# Patient Record
Sex: Male | Born: 1995 | Race: White | Hispanic: No | Marital: Single | Smoking: Never smoker
Health system: Southern US, Community
[De-identification: ages and names within clinical notes are randomized; demographics above are authoritative.]

## PROBLEM LIST (undated history)

## (undated) DIAGNOSIS — G43909 Migraine, unspecified, not intractable, without status migrainosus: Secondary | ICD-10-CM

---
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
## 2015-11-11 ENCOUNTER — Encounter (HOSPITAL_COMMUNITY): Payer: Self-pay

## 2015-11-11 DIAGNOSIS — L259 Unspecified contact dermatitis, unspecified cause: Secondary | ICD-10-CM | POA: Insufficient documentation

## 2015-11-11 NOTE — ED Triage Notes (Signed)
Pt states rash across body. Complaining of urticaria. Denies any SOB or chest pain. Pt states "working in woods recently."

## 2015-11-12 ENCOUNTER — Emergency Department (HOSPITAL_COMMUNITY)
Admission: EM | Admit: 2015-11-12 | Discharge: 2015-11-12 | Disposition: A | Discharge status: Home / Self Care | Payer: Self-pay | Attending: Emergency Medicine | Admitting: Emergency Medicine

## 2015-11-12 DIAGNOSIS — L259 Unspecified contact dermatitis, unspecified cause: Secondary | ICD-10-CM

## 2015-11-12 MED ORDER — HYDROXYZINE HCL 25 MG PO TABS: 25.0000 mg | ORAL_TABLET | Freq: Four times a day (QID) | ORAL | 0 refills | Status: AC

## 2015-11-12 MED ORDER — PREDNISONE 10 MG (21) PO TBPK: ORAL_TABLET | ORAL | 0 refills | Status: AC

## 2015-11-12 NOTE — ED Provider Notes (Signed)
[facility]-EMERGENCY DEPT Provider Note   CSN: [id number] Arrival date & time: 11/11/15  2210  First Provider Contact:  None       History   Chief Complaint Chief Complaint  Patient presents with  . Rash    HPI LJ MIYAMOTO is a 20 y.o. male.  Patient developed a red, pruritic rash after working in the woods yesterday.  Thinks he may have contacted poisonous plants.    Rash   This is a new problem. The current episode started yesterday. The problem has been gradually worsening. The problem is associated with plant contact. There has been no fever. The rash is present on the face and torso. The pain is at a severity of 0/10. Associated symptoms include itching. He has tried nothing for the symptoms.    History reviewed. No pertinent past medical history.  There are no active problems to display for this patient.   History reviewed. No pertinent surgical history.     Home Medications    Prior to Admission medications   Not on File    Family History History reviewed. No pertinent family history.  Social History Social History  Substance Use Topics  . Smoking status: Never Smoker  . Smokeless tobacco: Never Used  . Alcohol use No     Allergies   Review of patient's allergies indicates not on file.   Review of Systems Review of Systems  Skin: Positive for itching and rash.  All other systems reviewed and are negative.    Physical Exam Updated Vital Signs BP 127/71   Pulse (!) 56   Temp 98.1 F (36.7 C) (Oral)   Resp 14   Ht  (1.905 m)   Wt 68 kg   SpO2 98%   BMI 18.75 kg/m   Physical Exam  Constitutional: He is oriented to person, place, and time. He appears well-developed and well-nourished.  HENT:  Head: Normocephalic.  Vesicular rash on left lower face.  Eyes: Conjunctivae are normal. Pupils are equal, round, and reactive to light.  Neck: Neck supple.  Cardiovascular: Normal rate and regular rhythm.   Pulmonary/Chest: Effort  normal and breath sounds normal.  Abdominal: Soft. Bowel sounds are normal.  Musculoskeletal: Normal range of motion.  Lymphadenopathy:    He has no cervical adenopathy.  Neurological: He is alert and oriented to person, place, and time.  Skin: Rash noted. There is erythema.  Erythematous, pruritic, vesicular rash noted on face, torso, extremities.  Nursing note and vitals reviewed.    ED Treatments / Results  Labs (all labs ordered are listed, but only abnormal results are displayed) Labs Reviewed - No data to display  EKG  EKG Interpretation None       Radiology No results found.  Procedures Procedures (including critical care time)  Medications Ordered in ED Medications - No data to display   Initial Impression / Assessment and Plan / ED Course  I have reviewed the triage vital signs and the nursing notes.  Pertinent labs & imaging results that were available during my care of the patient were reviewed by me and considered in my medical decision making (see chart for details).  Clinical Course      Patient with contact dermatitis. Instructed to avoid offending agent and to use unscented soaps, lotions, and detergents. Will treat with atarax and prednisone.  No signs of secondary infection. Follow up with PCP in 2-3 days. Return precautions discussed. Pt is safe for discharge at this time.  Final Clinical Impressions(s) / ED Diagnoses   Final diagnoses:  None    New Prescriptions New Prescriptions   No medications on file     Felicie Mornavid Klauer, NP 11/12/15 [phone]    Tomasita CrumbleAdeleke Oni, MD 11/12/15 1635

## 2020-08-19 ENCOUNTER — Emergency Department (HOSPITAL_COMMUNITY)
Admission: EM | Admit: 2020-08-19 | Discharge: 2020-08-20 | Disposition: A | Discharge status: Home / Self Care | Payer: Self-pay | Attending: Emergency Medicine | Admitting: Emergency Medicine

## 2020-08-19 ENCOUNTER — Encounter (HOSPITAL_COMMUNITY): Payer: Self-pay

## 2020-08-19 ENCOUNTER — Emergency Department (HOSPITAL_COMMUNITY): Payer: Self-pay

## 2020-08-19 DIAGNOSIS — S022XXA Fracture of nasal bones, initial encounter for closed fracture: Secondary | ICD-10-CM | POA: Insufficient documentation

## 2020-08-19 DIAGNOSIS — S60511A Abrasion of right hand, initial encounter: Secondary | ICD-10-CM | POA: Insufficient documentation

## 2020-08-19 DIAGNOSIS — S0083XA Contusion of other part of head, initial encounter: Secondary | ICD-10-CM | POA: Insufficient documentation

## 2020-08-19 HISTORY — DX: Migraine, unspecified, not intractable, without status migrainosus: G43.909
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
## 2020-08-20 ENCOUNTER — Emergency Department (HOSPITAL_COMMUNITY): Payer: Self-pay

## 2020-08-20 MED ORDER — NAPROXEN 500 MG PO TABS: 500.0000 mg | ORAL_TABLET | Freq: Two times a day (BID) | ORAL | 0 refills | Status: AC

## 2020-08-20 MED ORDER — OXYCODONE HCL 5 MG PO TABS
10.0000 mg | ORAL_TABLET | Freq: Once | ORAL | Status: AC
  Administered 2020-08-20: 10 mg via ORAL
  Filled 2020-08-20: qty 2

## 2020-08-20 NOTE — Discharge Instructions (Signed)
1. Medications: alternate naprosyn and tylenol for pain control, usual home medications 2. Treatment: rest, ice, drink plenty of fluids, gentle stretching 3. Follow Up: Please followup with orthopedics as directed or your PCP in 1 week if no improvement for discussion of your diagnoses and further evaluation after today's visit; if you do not have a primary care doctor use the resource guide provided to find one; Please return to the ER for worsening symptoms or other concerns
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
